# Patient Record
Sex: Female | Born: 1960 | Race: White | Hispanic: No | Marital: Married | Smoking: Never smoker
Health system: Southern US, Community
[De-identification: ages and names within clinical notes are randomized; demographics above are authoritative.]

## PROBLEM LIST (undated history)

## (undated) DIAGNOSIS — I493 Ventricular premature depolarization: Secondary | ICD-10-CM

## (undated) DIAGNOSIS — K589 Irritable bowel syndrome without diarrhea: Secondary | ICD-10-CM

## (undated) DIAGNOSIS — E079 Disorder of thyroid, unspecified: Secondary | ICD-10-CM

## (undated) DIAGNOSIS — I491 Atrial premature depolarization: Secondary | ICD-10-CM

## (undated) DIAGNOSIS — K602 Anal fissure, unspecified: Secondary | ICD-10-CM

## (undated) DIAGNOSIS — F439 Reaction to severe stress, unspecified: Secondary | ICD-10-CM

## (undated) DIAGNOSIS — L409 Psoriasis, unspecified: Secondary | ICD-10-CM

## (undated) HISTORY — DX: Reaction to severe stress, unspecified: F43.9

## (undated) HISTORY — DX: Irritable bowel syndrome, unspecified: K58.9

## (undated) HISTORY — DX: Psoriasis, unspecified: L40.9

## (undated) HISTORY — DX: Atrial premature depolarization: I49.1

## (undated) HISTORY — DX: Anal fissure, unspecified: K60.2

## (undated) HISTORY — DX: Disorder of thyroid, unspecified: E07.9

## (undated) HISTORY — DX: Ventricular premature depolarization: I49.3

---

## 2002-11-05 ENCOUNTER — Other Ambulatory Visit: Admission: RE | Admit: 2002-11-05 | Discharge: 2002-11-05 | Payer: Self-pay | Admitting: Obstetrics & Gynecology

## 2003-12-27 ENCOUNTER — Other Ambulatory Visit: Admission: RE | Admit: 2003-12-27 | Discharge: 2003-12-27 | Payer: Self-pay | Admitting: Obstetrics & Gynecology

## 2004-03-20 ENCOUNTER — Other Ambulatory Visit: Admission: RE | Admit: 2004-03-20 | Discharge: 2004-03-20 | Payer: Self-pay | Admitting: Obstetrics & Gynecology

## 2004-05-08 ENCOUNTER — Encounter: Admission: RE | Admit: 2004-05-08 | Discharge: 2004-05-08 | Payer: Self-pay | Admitting: Family Medicine

## 2004-05-10 ENCOUNTER — Encounter: Admission: RE | Admit: 2004-05-10 | Discharge: 2004-05-10 | Payer: Self-pay | Admitting: Family Medicine

## 2005-01-22 ENCOUNTER — Other Ambulatory Visit: Admission: RE | Admit: 2005-01-22 | Discharge: 2005-01-22 | Payer: Self-pay | Admitting: Obstetrics & Gynecology

## 2006-02-08 ENCOUNTER — Other Ambulatory Visit: Admission: RE | Admit: 2006-02-08 | Discharge: 2006-02-08 | Payer: Self-pay | Admitting: Obstetrics & Gynecology

## 2007-10-03 ENCOUNTER — Encounter: Admission: RE | Admit: 2007-10-03 | Discharge: 2007-10-03 | Payer: Self-pay | Admitting: Family Medicine

## 2009-01-28 ENCOUNTER — Encounter: Admission: RE | Admit: 2009-01-28 | Discharge: 2009-01-28 | Payer: Self-pay | Admitting: Family Medicine

## 2009-08-22 ENCOUNTER — Ambulatory Visit (HOSPITAL_COMMUNITY): Admission: RE | Admit: 2009-08-22 | Discharge: 2009-08-22 | Payer: Self-pay | Admitting: Family Medicine

## 2010-12-04 ENCOUNTER — Encounter
Admission: RE | Admit: 2010-12-04 | Discharge: 2010-12-04 | Payer: Self-pay | Source: Home / Self Care | Attending: Family Medicine | Admitting: Family Medicine

## 2012-06-13 ENCOUNTER — Other Ambulatory Visit: Payer: Self-pay | Admitting: Obstetrics & Gynecology

## 2012-06-13 DIAGNOSIS — R928 Other abnormal and inconclusive findings on diagnostic imaging of breast: Secondary | ICD-10-CM

## 2012-06-22 ENCOUNTER — Ambulatory Visit
Admission: RE | Admit: 2012-06-22 | Discharge: 2012-06-22 | Disposition: A | Discharge status: Home / Self Care | Payer: BC Managed Care – PPO | Source: Ambulatory Visit | Attending: Obstetrics & Gynecology | Admitting: Obstetrics & Gynecology

## 2012-06-22 DIAGNOSIS — R928 Other abnormal and inconclusive findings on diagnostic imaging of breast: Secondary | ICD-10-CM

## 2014-03-02 ENCOUNTER — Encounter: Payer: Self-pay | Admitting: *Deleted

## 2014-03-02 DIAGNOSIS — E0590 Thyrotoxicosis, unspecified without thyrotoxic crisis or storm: Secondary | ICD-10-CM | POA: Insufficient documentation

## 2014-10-08 ENCOUNTER — Encounter: Payer: Self-pay | Admitting: Cardiovascular Disease

## 2014-10-08 ENCOUNTER — Ambulatory Visit (INDEPENDENT_AMBULATORY_CARE_PROVIDER_SITE_OTHER): Payer: BC Managed Care – PPO | Admitting: Cardiovascular Disease

## 2014-10-08 VITALS — BP 126/68 | HR 74 | Ht 69.5 in | Wt 141.8 lb

## 2014-10-08 DIAGNOSIS — R079 Chest pain, unspecified: Secondary | ICD-10-CM | POA: Insufficient documentation

## 2014-10-08 DIAGNOSIS — F419 Anxiety disorder, unspecified: Secondary | ICD-10-CM | POA: Insufficient documentation

## 2014-10-08 DIAGNOSIS — E0590 Thyrotoxicosis, unspecified without thyrotoxic crisis or storm: Secondary | ICD-10-CM

## 2014-10-08 DIAGNOSIS — Z630 Problems in relationship with spouse or partner: Secondary | ICD-10-CM

## 2014-10-08 DIAGNOSIS — R072 Precordial pain: Secondary | ICD-10-CM

## 2014-10-08 NOTE — Progress Notes (Signed)
Patient ID: Kaylee Mayo, female   DOB: Apr 02, 1961, 53 y.o.   MRN: [id number]   53 yo referred by Dr Johny BlamerWilliam Harris for atypical chest pain  Her life is a mess.  Her daughter is an addict and got out of fellowship in August. Still struggling Her grandson is with the father who is also an addict and Kaylee Mayo supervises visits.  She is not getting along with her 2nd husband of 15 years who is not Supportive of the situation since it is not his child.  She works for the [employer] service and there is some job stress She has family history of alcoholism and CAD.   She denies ETOH, drugs, smoking.  SSCP sharp left sided.  Can last hours  Radiates to left arm  Not related to exertion Has had it for a week or two No cough Sputum , fever dyspnea or pleuritic component       ROS: Denies fever, malais, weight loss, blurry vision, decreased visual acuity, cough, sputum, SOB, hemoptysis, pleuritic pain, palpitaitons, heartburn, abdominal pain, melena, lower extremity edema, claudication, or rash.  All other systems reviewed and negative   General: Affect appropriate Chronically ill appearing female  HEENT: normal Neck supple with no adenopathy JVP normal no bruits no thyromegaly Lungs clear with no wheezing and good diaphragmatic motion Heart:  S1/S2 no murmur,rub, gallop or click PMI normal Abdomen: benighn, BS positve, no tenderness, no AAA no bruit.  No HSM or HJR Distal pulses intact with no bruits No edema Neuro non-focal Skin warm and dry No muscular weakness  Medications Current Outpatient Prescriptions  Medication Sig Dispense Refill  . aspirin 81 MG tablet Take 81 mg by mouth daily.      . Calcium Carbonate-Vitamin D (CALCIUM-D PO) Take by mouth daily.      . cyclobenzaprine (FLEXERIL) 10 MG tablet Take 10 mg by mouth 3 (three) times daily as needed for muscle spasms.      . Glucosamine-Chondroit-Vit C-Mn (GLUCOSAMINE 1500 COMPLEX PO) Take by mouth.      . Levothyroxine Sodium  125 MCG CAPS Take by mouth daily before breakfast.      . Multiple Vitamin (MULTIVITAMIN) tablet Take 1 tablet by mouth daily.      . norgestimate-ethinyl estradiol (SPRINTEC 28) 0.25-35 MG-MCG tablet Take 1 tablet by mouth daily.      . Omega-3 Fatty Acids (FISH OIL) 1000 MG CAPS Take by mouth.      . vitamin C (ASCORBIC ACID) 500 MG tablet Take 500 mg by mouth daily.       No current facility-administered medications for this visit.    Allergies Daypro  Family History: Family History  Problem Relation Age of Onset  . Diabetes Mellitus I Mother   . Heart failure Father   . Diabetes Mellitus I Father   . Coronary artery disease Father   . Diabetes Mellitus I Paternal Grandmother   . Hypertension Paternal Grandfather     Social History: History   Social History  . Marital Status: Married    Spouse Name: N/A    Number of Children: N/A  . Years of Education: N/A   Occupational History  . Not on file.   Social History Main Topics  . Smoking status: Never Smoker   . Smokeless tobacco: Not on file  . Alcohol Use: No  . Drug Use: No  . Sexual Activity: Not on file   Other Topics Concern  . Not on file   Social History  Narrative  . No narrative on file    No past surgical history on file.  Past Medical History  Diagnosis Date  . Thyroid disease   . IBS (irritable bowel syndrome)   . Anal fissure   . Psoriasis     Electrocardiogram:    NSR rate 74 normal   Assessment and Plan

## 2014-10-08 NOTE — Assessment & Plan Note (Signed)
Atypical likely related to stress f/u exercise ETT given family history

## 2014-10-08 NOTE — Patient Instructions (Signed)

## 2014-10-08 NOTE — Assessment & Plan Note (Signed)
Continue replacement recent TSH normal f/u primary

## 2014-10-08 NOTE — Assessment & Plan Note (Signed)
Primary has given her PRN lorazapam  Consider adding SSRI

## 2014-10-08 NOTE — Assessment & Plan Note (Signed)
Issues with husband, son in law and daughters addiction all devistating for her.  Not clear that she can stay healthy in the middle of  All this especially with grandson not living with her.  She has tried to go to marriage counseling but husband will not go.

## 2014-10-09 ENCOUNTER — Telehealth (HOSPITAL_COMMUNITY): Payer: Self-pay

## 2014-10-09 NOTE — Telephone Encounter (Signed)
Encounter complete. 

## 2014-10-10 ENCOUNTER — Telehealth (HOSPITAL_COMMUNITY): Payer: Self-pay

## 2014-10-10 NOTE — Telephone Encounter (Signed)
Encounter complete. 

## 2014-10-11 ENCOUNTER — Ambulatory Visit (HOSPITAL_COMMUNITY)
Admission: RE | Admit: 2014-10-11 | Discharge: 2014-10-11 | Disposition: A | Discharge status: Home / Self Care | Payer: BC Managed Care – PPO | Source: Ambulatory Visit | Attending: Internal Medicine | Admitting: Internal Medicine

## 2014-10-11 DIAGNOSIS — R079 Chest pain, unspecified: Secondary | ICD-10-CM | POA: Diagnosis present

## 2014-10-11 NOTE — Procedures (Signed)
Exercise Treadmill Test  Test  Exercise Tolerance Test Ordering MD: Charlton HawsPeter Nishan, MD  Interpreting MD:   Unique Test No: 1 Treadmill:  1  Indication for ETT: chest pain - rule out ischemia  Contraindication to ETT: No   Stress Modality: exercise - treadmill  Cardiac Imaging Performed: non   Protocol: standard Bruce - maximal  Max BP:  182/94  Max MPHR (bpm):167 85% MPR (bpm):  141  MPHR obtained (bpm): 164 % MPHR obtained:  98  Reached 85% MPHR (min:sec): 7:55 Total Exercise Time (min-sec):  10:57  Workload in METS:  13.30 Borg Scale:   Reason ETT Terminated: leg  fatigue    ST Segment Analysis At Rest: normal ST segments - no evidence of significant ST depression With Exercise: upsloping ST segment depression  Other Information Arrhythmia:  No Angina during ETT:  absent (0) Quality of ETT:  diagnostic  ETT Interpretation:  normal - no evidence of ischemia by ST analysis  Comments: Excellent exercise tolerance <1 mm upsloping ST segment changes with exercise Duke Treadmill Score of +11 PVC's noted during exercise and recovery  Chrystie NoseKenneth C. Hilty, MD, [facility]ACC Attending Cardiologist [facility]

## 2014-10-16 ENCOUNTER — Telehealth: Payer: Self-pay | Admitting: Cardiovascular Disease

## 2014-10-16 NOTE — Telephone Encounter (Signed)
Patient advised treadmill test was normal, voiced  Good understanding.

## 2014-10-16 NOTE — Telephone Encounter (Signed)
New message    Calling for test results  

## 2014-10-28 ENCOUNTER — Ambulatory Visit: Payer: BC Managed Care – PPO | Admitting: Cardiology

## 2015-11-13 ENCOUNTER — Ambulatory Visit
Admission: RE | Admit: 2015-11-13 | Discharge: 2015-11-13 | Disposition: A | Discharge status: Home / Self Care | Payer: BC Managed Care – PPO | Source: Ambulatory Visit | Attending: Family Medicine | Admitting: Family Medicine

## 2015-11-13 ENCOUNTER — Other Ambulatory Visit: Payer: Self-pay | Admitting: Family Medicine

## 2015-11-13 DIAGNOSIS — R0789 Other chest pain: Secondary | ICD-10-CM

## 2017-05-01 IMAGING — CR DG RIBS W/ CHEST 3+V*R*
3 series · 3 of 3 positions shown · non-contrast
Comparison: CT scan of the chest December 04, 2010

CLINICAL DATA: Palpable chest wall prominence in the right lower
parasternal region, duration of symptoms 7 months

EXAM:
RIGHT RIBS AND CHEST - 3+ VIEW

[w chest pa]
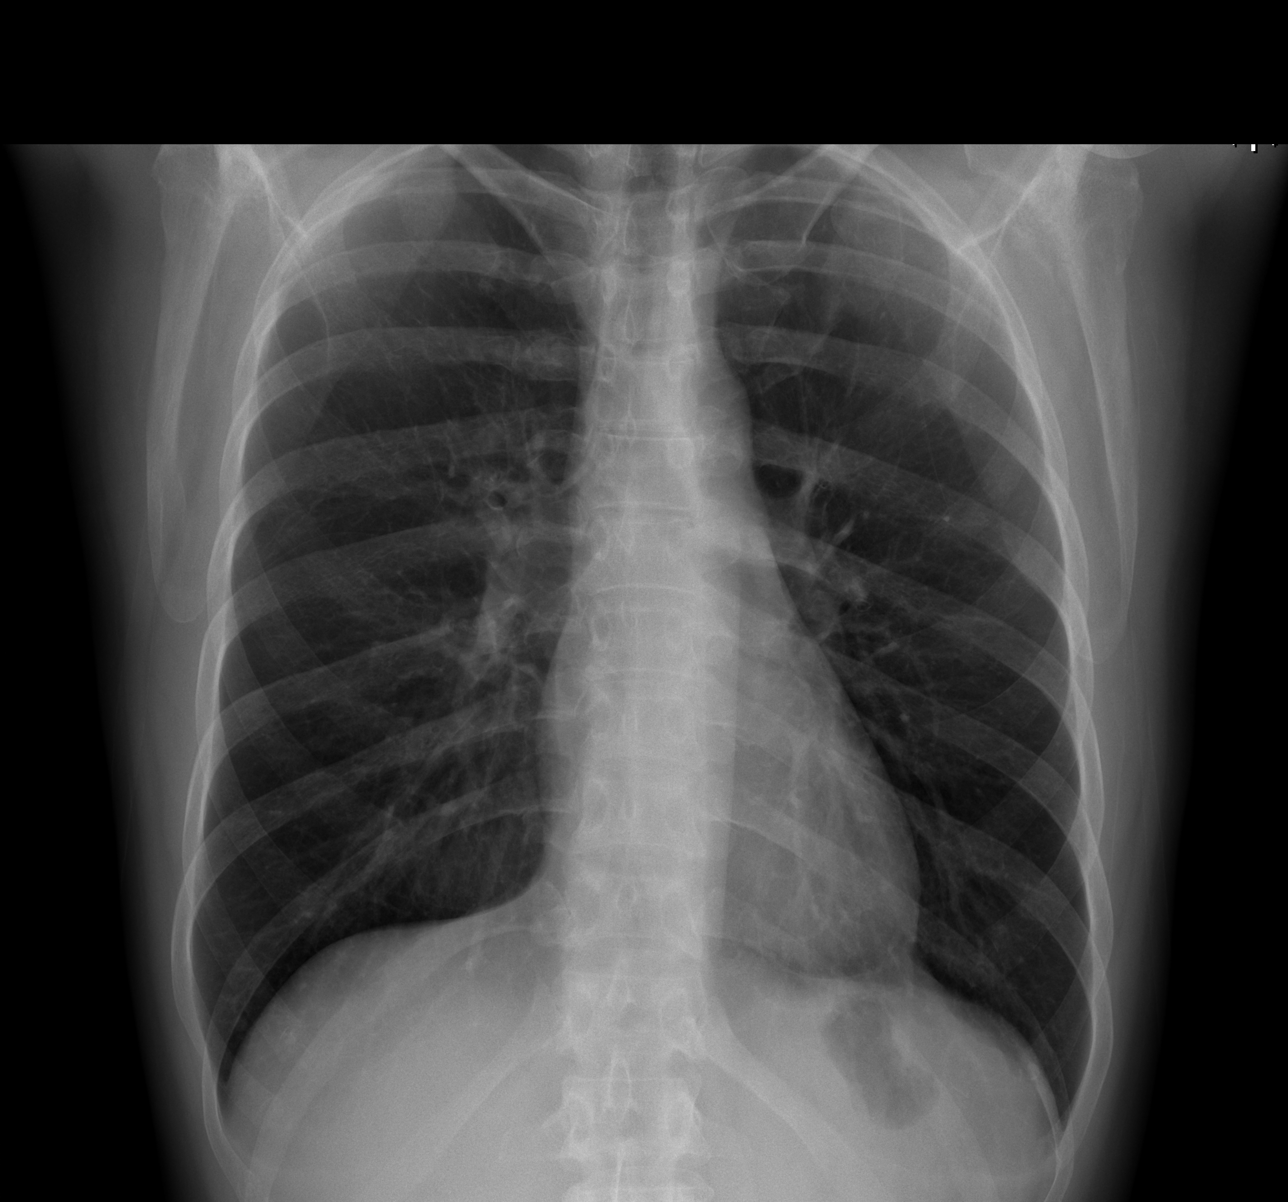

[w ribs ap lower right]
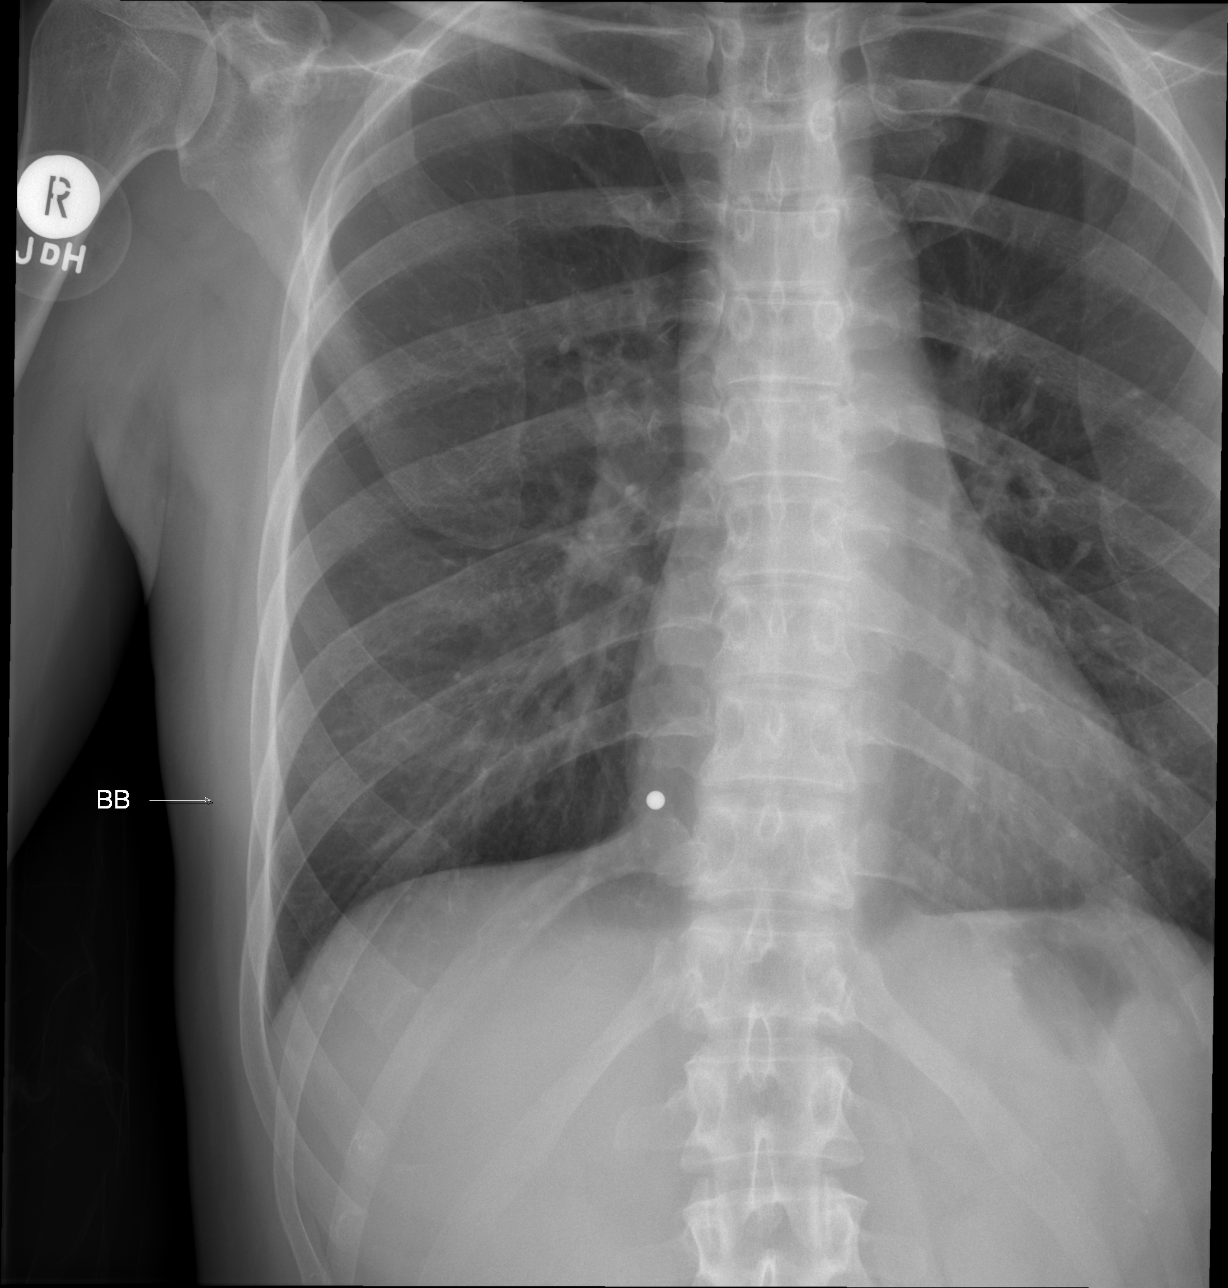

[w ribs obl right]
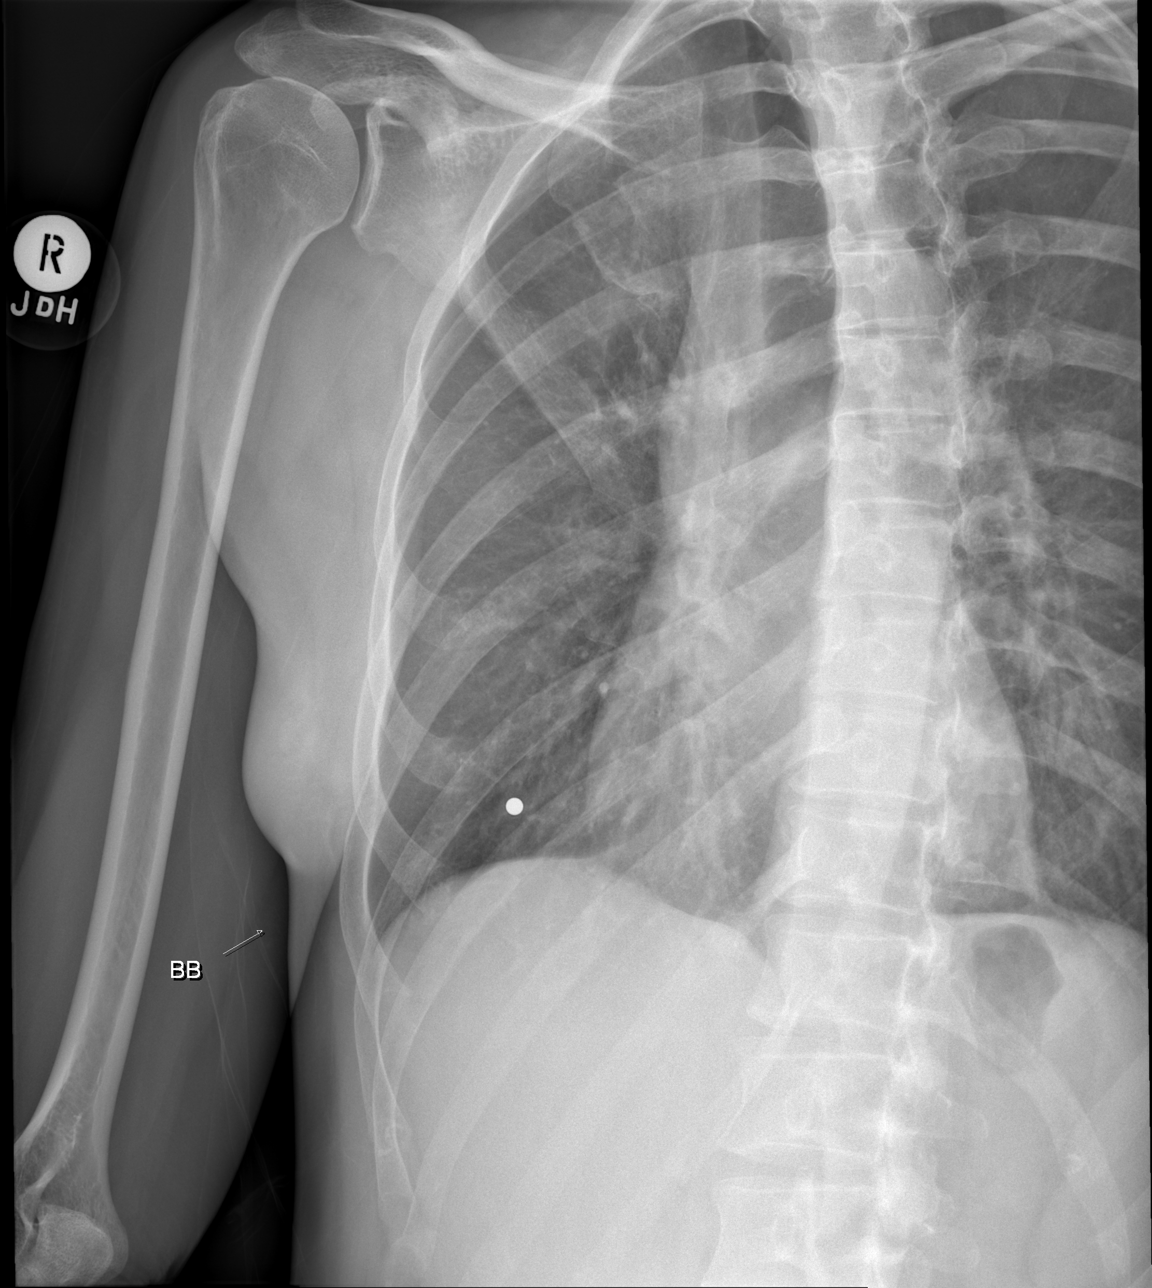

[3 of 3 positions shown; findings below may reference images not displayed]

FINDINGS: The right ribs are adequately mineralized. There is no lytic or
blastic lesion or periosteal reaction. The metallic BB projects in
the region of the medial portion of the 6th costal cartilage. The
lungs are clear. The heart and mediastinal structures are normal.
IMPRESSION: No bony abnormality is observed. No soft tissue mass is demonstrated
in the area of clinical concern. If further imaging is felt
indicated, CT scanning would be the most useful next step. This
would allow direct comparison with the November 2010 chest CT scan.

## 2017-09-09 ENCOUNTER — Telehealth: Payer: Self-pay | Admitting: *Deleted

## 2017-09-09 NOTE — Telephone Encounter (Signed)
NOTES SENT TO SCHEDULING.  °

## 2017-10-11 NOTE — Progress Notes (Signed)
Cardiology Office Note   Date:  10/14/2017   ID:  Kaylee Mayo, DOB [birth year], MRN [id number]  PCP:  Johny Blamer, MD  Cardiologist:   Charlton Haws, MD   No chief complaint on file.     History of Present Illness: Kaylee Mayo is a 56 y.o. female who presents for consultation regarding palpitations. Referred by Dr Tiburcio Pea Last seen by me 3 years ago. Atypical chest pain Had normal ETT 10/11/14 Daughter is an addict and been in [facility] At time stress with 2nd husband and caring for grandson. Works for [employer] Family history of alcoholism and CAD  Having daily palpitations Thyroid has been labile and dose synthroid decreased twice recently No syncope chest pain or syncope. In office having PAC;s. Some stress relationship. No drugs or Excess caffeine    Past Medical History:  Diagnosis Date  . Anal fissure   . IBS (irritable bowel syndrome)   . Psoriasis   . Thyroid disease     History reviewed. No pertinent surgical history.   Current Outpatient Prescriptions  Medication Sig Dispense Refill  . aspirin 81 MG tablet Take 81 mg by mouth daily as needed.     . Calcium Carbonate-Vitamin D (CALCIUM-D PO) Take by mouth daily.    . cyclobenzaprine (FLEXERIL) 10 MG tablet Take 10 mg by mouth 3 (three) times daily as needed for muscle spasms.    . fluticasone (FLONASE) 50 MCG/ACT nasal spray Place 1 spray into both nostrils as needed.    . Glucosamine-Chondroit-Vit C-Mn (GLUCOSAMINE 1500 COMPLEX PO) Take by mouth.    . IBUPROFEN PO Take by mouth as needed (back pain).    Marland Kitchen levothyroxine (SYNTHROID, LEVOTHROID) 100 MCG tablet Take 100 mcg by mouth daily.    . meloxicam (MOBIC) 15 MG tablet Take 15 mg by mouth as needed.    . Multiple Vitamin (MULTIVITAMIN) tablet Take 1 tablet by mouth daily.    . norgestimate-ethinyl estradiol (SPRINTEC 28) 0.25-35 MG-MCG tablet Take 1 tablet by mouth daily.    . Omega-3 Fatty Acids (FISH OIL) 1000 MG CAPS Take by  mouth.    . ValACYclovir HCl (VALTREX PO) Take by mouth as needed (Cold sore).    . vitamin C (ASCORBIC ACID) 500 MG tablet Take 500 mg by mouth daily.    . propranolol (INDERAL) 20 MG tablet Take 1 tablet (20 mg total) by mouth as needed (palpitations). 30 tablet 3   No current facility-administered medications for this visit.     Allergies:   Sulfa antibiotics and Daypro [oxaprozin]    Social History:  The patient  reports that she has never smoked. She has never used smokeless tobacco. She reports that she does not drink alcohol or use drugs.   Family History:  The patient's family history includes Coronary artery disease in her father; Diabetes Mellitus I in her father, mother, and paternal grandmother; Heart failure in her father; Hypertension in her paternal grandfather.    ROS:  Please see the history of present illness.   Otherwise, review of systems are positive for none.   All other systems are reviewed and negative.    PHYSICAL EXAM: VS:  BP 108/66   Pulse 99   Ht 5' 9.5" (1.765 m)   Wt 148 lb 1.9 oz (67.2 kg)   SpO2 99%   BMI 21.56 kg/m  , BMI Body mass index is 21.56 kg/m. Affect appropriate Healthy:  appears stated age HEENT: normal Neck supple with no adenopathy  JVP normal no bruits no thyromegaly Lungs clear with no wheezing and good diaphragmatic motion Heart:  S1/S2 no murmur, no rub, gallop or click PMI normal Abdomen: benighn, BS positve, no tenderness, no AAA no bruit.  No HSM or HJR Distal pulses intact with no bruits No edema Neuro non-focal Skin warm and dry No muscular weakness    EKG:  2015 SR rate 74 normal m11/2/18 SR PAC;s LAE    Recent Labs: No results found for requested labs within last 8760 hours.    Lipid Panel No results found for: CHOL, TRIG, HDL, CHOLHDL, VLDL, LDLCALC, LDLDIRECT    Wt Readings from Last 3 Encounters:  10/14/17 148 lb 1.9 oz (67.2 kg)  10/08/14 141 lb 12.8 oz (64.3 kg)      Other studies  Reviewed: Additional studies/ records that were reviewed today include: Cardiology notes 2015 Primary care notes labs ECG ETT 2015.    ASSESSMENT AND PLAN:  1.  Palpitations PAC;s likely related to stress and abnormal thyroid fluctuations PRN Inderal, 48 hour holter And echo ordered f/u 3 months R/O PAF  2. Thyroid follow TSH/T4/T3 with primary more closely. Related to arrhythmia 3. Anxiety / Depression chronic related to inability to see grand kids regularly and daughter being in jail    Current medicines are reviewed at length with the patient today.  The patient does not have concerns regarding medicines.  The following changes have been made:  PRN Inderal   Labs/ tests ordered today include: Holter, Echo   Orders Placed This Encounter  Procedures  . HOLTER MONITOR - 48 HOUR  . EKG 12-Lead  . ECHOCARDIOGRAM COMPLETE     Disposition:   FU with cardiology PRN      Signed, Charlton HawsPeter Xavien Dauphinais, MD  10/14/2017 8:55 AM    [facility] [address], [place], [place]  [address] Phone: [phone]; Fax: [phone]

## 2017-10-14 ENCOUNTER — Encounter (INDEPENDENT_AMBULATORY_CARE_PROVIDER_SITE_OTHER): Payer: Self-pay

## 2017-10-14 ENCOUNTER — Ambulatory Visit (INDEPENDENT_AMBULATORY_CARE_PROVIDER_SITE_OTHER): Payer: BC Managed Care – PPO | Admitting: Cardiovascular Disease

## 2017-10-14 ENCOUNTER — Encounter: Payer: Self-pay | Admitting: Cardiovascular Disease

## 2017-10-14 VITALS — BP 108/66 | HR 99 | Ht 69.5 in | Wt 148.1 lb

## 2017-10-14 DIAGNOSIS — R002 Palpitations: Secondary | ICD-10-CM | POA: Diagnosis not present

## 2017-10-14 DIAGNOSIS — I491 Atrial premature depolarization: Secondary | ICD-10-CM

## 2017-10-14 MED ORDER — PROPRANOLOL HCL 20 MG PO TABS: 20.0000 mg | ORAL_TABLET | ORAL | 3 refills | Status: DC | PRN

## 2017-10-14 NOTE — Patient Instructions (Addendum)
Medication Instructions:  Your physician has recommended you make the following change in your medication:  1-START Inderal 20 mg by mouth as needed for palpitations.  Labwork: NONE  Testing/Procedures: Your physician has requested that you have an echocardiogram. Echocardiography is a painless test that uses sound waves to create images of your heart. It provides your doctor with information about the size and shape of your heart and how well your heart's chambers and valves are working. This procedure takes approximately one hour. There are no restrictions for this procedure.  Your physician has recommended that you wear a 48 hour holter monitor. Holter monitors are medical devices that record the heart's electrical activity. Doctors most often use these monitors to diagnose arrhythmias. Arrhythmias are problems with the speed or rhythm of the heartbeat. The monitor is a small, portable device. You can wear one while you do your normal daily activities. This is usually used to diagnose what is causing palpitations/syncope (passing out).  Follow-Up: Your physician wants you to follow-up in: next available with Dr. Eden EmmsNishan.    If you need a refill on your cardiac medications before your next appointment, please call your pharmacy.

## 2017-11-15 ENCOUNTER — Ambulatory Visit (INDEPENDENT_AMBULATORY_CARE_PROVIDER_SITE_OTHER): Payer: BC Managed Care – PPO

## 2017-11-15 ENCOUNTER — Other Ambulatory Visit: Payer: Self-pay

## 2017-11-15 ENCOUNTER — Ambulatory Visit (HOSPITAL_COMMUNITY): Payer: BC Managed Care – PPO | Attending: Cardiology

## 2017-11-15 DIAGNOSIS — I491 Atrial premature depolarization: Secondary | ICD-10-CM | POA: Diagnosis not present

## 2017-11-15 DIAGNOSIS — R002 Palpitations: Secondary | ICD-10-CM | POA: Diagnosis not present

## 2017-11-15 DIAGNOSIS — E079 Disorder of thyroid, unspecified: Secondary | ICD-10-CM | POA: Insufficient documentation

## 2017-11-24 ENCOUNTER — Telehealth: Payer: Self-pay

## 2017-11-24 DIAGNOSIS — I441 Atrioventricular block, second degree: Secondary | ICD-10-CM

## 2017-11-24 DIAGNOSIS — R9431 Abnormal electrocardiogram [ECG] [EKG]: Secondary | ICD-10-CM

## 2017-11-24 DIAGNOSIS — Z9189 Other specified personal risk factors, not elsewhere classified: Secondary | ICD-10-CM

## 2017-11-24 NOTE — Telephone Encounter (Signed)
-----   Message from Wendall StadePeter C Nishan, MD sent at 11/23/2017  9:35 PM EST ----- Abnormal would refer to EP for consultations in regard to suppressing ectopy in face of some Wenkebach AV block Have her schedule exercise myovue as well r/o CAD Echo showed normal EF

## 2017-11-24 NOTE — Telephone Encounter (Signed)
Left message for patient to call back  

## 2017-12-13 NOTE — Progress Notes (Deleted)
Cardiology Office Note   Date:  12/13/2017   ID:  Kaylee Popperancy L Hagin, DOB 01/01/1961, MRN [id number]  PCP:  Johny BlamerHarris, William, MD  Cardiologist:   Charlton HawsPeter Nishan, MD   No chief complaint on file.     History of Present Illness: Kaylee Mayo is a 57 y.o. female who presents for f/u palpitations Initially seen November 2018 . Referred by Dr Tiburcio PeaHarris Last seen by me 3 years ago. Atypical chest pain Had normal ETT 10/11/14 Daughter is an addict and been in [facility] At time stress with 2nd husband and caring for grandson. Works for [employer] Family history of alcoholism and CAD  Having daily palpitations Thyroid has been labile and dose synthroid decreased twice recently No syncope chest pain or syncope. In office having PAC;s. Some stress relationship. No drugs or Excess caffeine   Echo 11/15/17 showed EF 55-60% trivial MR normal RV and PA pressures Holter showed NSR average HR 70 short bursts atrial tachycardia NSVT 3 beats Rare episodes of wenkebach type 2  She is pending a myocardial perfusion study and f/u with EP   ***  Past Medical History:  Diagnosis Date  . Anal fissure   . IBS (irritable bowel syndrome)   . Psoriasis   . Thyroid disease     No past surgical history on file.   Current Outpatient Medications  Medication Sig Dispense Refill  . aspirin 81 MG tablet Take 81 mg by mouth daily as needed.     . Calcium Carbonate-Vitamin D (CALCIUM-D PO) Take by mouth daily.    . cyclobenzaprine (FLEXERIL) 10 MG tablet Take 10 mg by mouth 3 (three) times daily as needed for muscle spasms.    . fluticasone (FLONASE) 50 MCG/ACT nasal spray Place 1 spray into both nostrils as needed.    . Glucosamine-Chondroit-Vit C-Mn (GLUCOSAMINE 1500 COMPLEX PO) Take by mouth.    . IBUPROFEN PO Take by mouth as needed (back pain).    Marland Kitchen. levothyroxine (SYNTHROID, LEVOTHROID) 100 MCG tablet Take 100 mcg by mouth daily.    . meloxicam (MOBIC) 15 MG tablet Take 15 mg by mouth as  needed.    . Multiple Vitamin (MULTIVITAMIN) tablet Take 1 tablet by mouth daily.    . norgestimate-ethinyl estradiol (SPRINTEC 28) 0.25-35 MG-MCG tablet Take 1 tablet by mouth daily.    . Omega-3 Fatty Acids (FISH OIL) 1000 MG CAPS Take by mouth.    . propranolol (INDERAL) 20 MG tablet Take 1 tablet (20 mg total) by mouth as needed (palpitations). 30 tablet 3  . ValACYclovir HCl (VALTREX PO) Take by mouth as needed (Cold sore).    . vitamin C (ASCORBIC ACID) 500 MG tablet Take 500 mg by mouth daily.     No current facility-administered medications for this visit.     Allergies:   Sulfa antibiotics and Daypro [oxaprozin]    Social History:  The patient  reports that  has never smoked. she has never used smokeless tobacco. She reports that she does not drink alcohol or use drugs.   Family History:  The patient's family history includes Coronary artery disease in her father; Diabetes Mellitus I in her father, mother, and paternal grandmother; Heart failure in her father; Hypertension in her paternal grandfather.    ROS:  Please see the history of present illness.   Otherwise, review of systems are positive for none.   All other systems are reviewed and negative.    PHYSICAL EXAM: VS:  There were no vitals  taken for this visit. , BMI There is no height or weight on file to calculate BMI. Affect appropriate Healthy:  appears stated age HEENT: normal Neck supple with no adenopathy JVP normal no bruits no thyromegaly Lungs clear with no wheezing and good diaphragmatic motion Heart:  S1/S2 no murmur, no rub, gallop or click PMI normal Abdomen: benighn, BS positve, no tenderness, no AAA no bruit.  No HSM or HJR Distal pulses intact with no bruits No edema Neuro non-focal Skin warm and dry No muscular weakness     EKG:  2015 SR rate 74 normal m11/2/18 SR PAC;s LAE    Recent Labs: No results found for requested labs within last 8760 hours.    Lipid Panel No results found for:  CHOL, TRIG, HDL, CHOLHDL, VLDL, LDLCALC, LDLDIRECT    Wt Readings from Last 3 Encounters:  10/14/17 148 lb 1.9 oz (67.2 kg)  10/08/14 141 lb 12.8 oz (64.3 kg)      Other studies Reviewed: Additional studies/ records that were reviewed today include: Cardiology notes 2015 Primary care notes labs ECG ETT 2015.    ASSESSMENT AND PLAN:  1.  Palpitations *** 2. Thyroid follow TSH/T4/T3 with primary more closely. Related to arrhythmia 3. Anxiety / Depression chronic related to inability to see grand kids regularly and daughter being in jail    Current medicines are reviewed at length with the patient today.  The patient does not have concerns regarding medicines.  The following changes have been made:     Labs/ tests ordered today include:    No orders of the defined types were placed in this encounter.    Disposition:   FU with EP 8 weeks and me in 6 months     Signed, Charlton Haws, MD  12/13/2017 7:45 PM    [facility] [address] [place], [place], [place]  [address] Phone: [phone]; Fax: [phone]

## 2017-12-16 ENCOUNTER — Telehealth: Payer: Self-pay | Admitting: Cardiovascular Disease

## 2017-12-16 NOTE — Telephone Encounter (Signed)
Mrs. Kaylee Mayo is calling to find out about a test and whether she needs to keep her appt with Dr. Eden EmmsNishan . Please Call +

## 2017-12-16 NOTE — Telephone Encounter (Signed)
Called patient back about her message. Patient is suppose to see an EP doctor and have a myoview after her previous testing. [facility]. Informed patient the reason behind having the myoview test done, and informed her that she needs to contact her insurance for cost. Informed patient that someone from [facility] Would call her and discuss in detail of all that involves myoview test. Informed patient that someone in scheduling will call her with an appointment with EP doctor. Patient verbalized understanding.

## 2017-12-21 ENCOUNTER — Telehealth: Payer: Self-pay | Admitting: Cardiovascular Disease

## 2017-12-21 ENCOUNTER — Telehealth (HOSPITAL_COMMUNITY): Payer: Self-pay | Admitting: Cardiovascular Disease

## 2017-12-21 DIAGNOSIS — Z9189 Other specified personal risk factors, not elsewhere classified: Secondary | ICD-10-CM

## 2017-12-21 DIAGNOSIS — R9431 Abnormal electrocardiogram [ECG] [EKG]: Secondary | ICD-10-CM

## 2017-12-21 DIAGNOSIS — R002 Palpitations: Secondary | ICD-10-CM

## 2017-12-21 DIAGNOSIS — I441 Atrioventricular block, second degree: Secondary | ICD-10-CM

## 2017-12-21 NOTE — Telephone Encounter (Signed)
I called patient and she stated that she needed to check and see how much the test would cost before scheduling.

## 2017-12-21 NOTE — Telephone Encounter (Signed)
Patient is suppose to have a myoview stress test, but this is too expensive for her. Patient wants to know if there is an alternative to having myoview. Will forward to Dr. Eden EmmsNishan for advisement.

## 2017-12-21 NOTE — Telephone Encounter (Signed)
New Message   Patient is calling about an echo stress that Dr. Eden EmmsNishan is wanting her to have. She indicates that it will be too expensive for her to have. She would like to know does she have any other options that are less cost effective. Please call.

## 2017-12-22 ENCOUNTER — Ambulatory Visit: Payer: BC Managed Care – PPO | Admitting: Cardiovascular Disease

## 2017-12-22 NOTE — Telephone Encounter (Signed)
Her baseline ECG has some changes on it so ETT not possible see if stress echo is cheaper

## 2017-12-22 NOTE — Telephone Encounter (Signed)
Called patient about Dr. Fabio BeringNishan's recommendations. Patient will check with her insurance to see how much this will cost her.

## 2017-12-29 ENCOUNTER — Telehealth (HOSPITAL_COMMUNITY): Payer: Self-pay | Admitting: Cardiovascular Disease

## 2017-12-29 ENCOUNTER — Telehealth: Payer: Self-pay | Admitting: Cardiovascular Disease

## 2017-12-29 NOTE — Telephone Encounter (Signed)
New message    Patient calling to confirm testing needed. Patient given the estimate cost of stress echo $2500 (est only). Patient requesting to speak with nurse to clarify testing. Please call

## 2017-12-29 NOTE — Telephone Encounter (Signed)
User: Trina AoGRIFFIN, Jonica Bickhart A Date/time: 12/29/17 1:15 PM  Comment: Called pt back and lmsg for her to Cb to sch stress echo .   Context:  Outcome: Left Message  Phone number: [phone] Phone Type: Home Phone  Comm. type: Telephone Call type: Outgoing  Contact: Biagio BorgBlackwood, Teona L Relation to patient: Self    User: Trina AoGRIFFIN, Molly Savarino A Date/time: 12/29/17 11:33 AM  Comment: Called pt and lmsg for her to CB to get scheduled for a stress echo.   Context:  Outcome: Left Message  Phone number: [phone] Phone Type: Home Phone  Comm. type: Telephone Call type: Outgoing  Contact: Biagio BorgBlackwood, Starletta L Relation to patient: Self

## 2017-12-29 NOTE — Telephone Encounter (Signed)
Called patient and discussed what is involved in a stress echo. Patient has agreed to schedule stress echo. Sent message to scheduling to make patient an appointment. Went over instructions for test with patient. Patient verbalized understanding.

## 2018-01-05 ENCOUNTER — Telehealth (HOSPITAL_COMMUNITY): Payer: Self-pay | Admitting: *Deleted

## 2018-01-05 NOTE — Telephone Encounter (Signed)
Left message on voicemail per DPR in reference to upcoming appointment scheduled on 12/30/17 at 2:30 with detailed instructions given per Stress Test Requisition Sheet for the test. LM to arrive 30 minutes early, and that it is imperative to arrive on time for appointment to keep from having the test rescheduled. If you need to cancel or reschedule your appointment, please call the office within 24 hours of your appointment. Failure to do so may result in a cancellation of your appointment, and a $50 no show fee. Phone number given for call back for any questions. Daneil DolinSharon S Brooks

## 2018-01-09 ENCOUNTER — Ambulatory Visit (HOSPITAL_COMMUNITY): Payer: BC Managed Care – PPO | Attending: Cardiology

## 2018-01-09 ENCOUNTER — Ambulatory Visit (HOSPITAL_COMMUNITY): Payer: BC Managed Care – PPO

## 2018-01-09 DIAGNOSIS — R002 Palpitations: Secondary | ICD-10-CM

## 2018-01-09 DIAGNOSIS — I441 Atrioventricular block, second degree: Secondary | ICD-10-CM

## 2018-01-09 DIAGNOSIS — Z9189 Other specified personal risk factors, not elsewhere classified: Secondary | ICD-10-CM | POA: Diagnosis not present

## 2018-01-09 DIAGNOSIS — R9431 Abnormal electrocardiogram [ECG] [EKG]: Secondary | ICD-10-CM

## 2018-01-10 ENCOUNTER — Telehealth: Payer: Self-pay | Admitting: Cardiovascular Disease

## 2018-01-10 NOTE — Telephone Encounter (Signed)
New message ° °Pt verbalized that she is returning call for RN °

## 2018-01-11 NOTE — Telephone Encounter (Signed)
Patient is wanting to know if she still needs to see an EP doctor. Informed patient that she still needs to see EP doctor. Informed patient that a message would be sent to EP scheduler to call her. Encouraged patient to take propranolol for palpitations. Patient stated she does not feel like she needs it every day. Informed patient that if she is taking it more often to give our office a call. Patient verbalized understanding.

## 2018-01-11 NOTE — Telephone Encounter (Signed)
Left message for patient to call back  

## 2018-02-07 ENCOUNTER — Encounter: Payer: Self-pay | Admitting: Internal Medicine

## 2018-02-07 ENCOUNTER — Ambulatory Visit: Payer: BC Managed Care – PPO | Admitting: Internal Medicine

## 2018-02-07 VITALS — BP 130/70 | HR 92 | Ht 69.5 in

## 2018-02-07 DIAGNOSIS — R002 Palpitations: Secondary | ICD-10-CM

## 2018-02-07 DIAGNOSIS — I441 Atrioventricular block, second degree: Secondary | ICD-10-CM

## 2018-02-07 NOTE — Progress Notes (Signed)
ELECTROPHYSIOLOGY CONSULT NOTE  Patient ID: Kaylee Mayo, MRN: [id number], DOB/AGE: 58-Oct-1962 58 y.o. Admit date: (Not on file) Date of Consult: 02/07/2018  Primary Physician: Johny Blamer, MD Primary Cardiologist: Darrin Nipper Armstead is a 57 y.o. female who is being seen today for the evaluation of palpiattions  at the request of [facility] .    HPI Kaylee Mayo is a 57 y.o. female Referred because of palpitations. They became increasingly frequent after deployment for [facility]   She was seen by Dr. Genene Churn a couple of months ago.  She was noted to have PACs in the office.  She underwent Holter monitoring which is described below.  It was interpreted as Wenckebach and with her history of modest bradycardia question was raised as to the right treatment for identified PVCs and PACs that were quite frequent  She underwent an extensive cardiovascular evaluation including a stress echo and noted to have freq PVCs and PACs, although strips are not available.   Echo 12/18 was normal  No issues with exercise tolerance  She works as a Therapist, art reviewed  No Wenckebach noted despite it described---indeed pauses but only PACs-blocked freq PACs and PVCs  Past Medical History:  Diagnosis Date  . Anal fissure   . IBS (irritable bowel syndrome)   . Psoriasis   . Thyroid disease       Surgical History: History reviewed. No pertinent surgical history.   Home Meds: Prior to Admission medications   Medication Sig Start Date End Date Taking? Authorizing Provider  aspirin 81 MG tablet Take 81 mg by mouth daily as needed.     [provider]  Calcium Carbonate-Vitamin D (CALCIUM-D PO) Take by mouth daily.    [provider]  cyclobenzaprine (FLEXERIL) 10 MG tablet Take 10 mg by mouth 3 (three) times daily as needed for muscle spasms.    [provider]  fluticasone (FLONASE) 50 MCG/ACT nasal spray Place 1 spray into  both nostrils as needed. 09/30/17   [provider]  Glucosamine-Chondroit-Vit C-Mn (GLUCOSAMINE 1500 COMPLEX PO) Take by mouth.    [provider]  IBUPROFEN PO Take by mouth as needed (back pain).    [provider]  levothyroxine (SYNTHROID, LEVOTHROID) 100 MCG tablet Take 100 mcg by mouth daily. 10/11/17   [provider]  meloxicam (MOBIC) 15 MG tablet Take 15 mg by mouth as needed. 10/06/17   [provider]  Multiple Vitamin (MULTIVITAMIN) tablet Take 1 tablet by mouth daily.    [provider]  norgestimate-ethinyl estradiol (SPRINTEC 28) 0.25-35 MG-MCG tablet Take 1 tablet by mouth daily.    [provider]  Omega-3 Fatty Acids (FISH OIL) 1000 MG CAPS Take by mouth.    [provider]  propranolol (INDERAL) 20 MG tablet Take 1 tablet (20 mg total) by mouth as needed (palpitations). 10/14/17   Wendall Stade, MD  ValACYclovir HCl (VALTREX PO) Take by mouth as needed (Cold sore).    [provider]  vitamin C (ASCORBIC ACID) 500 MG tablet Take 500 mg by mouth daily.    [provider]    Allergies:  Allergies  Allergen Reactions  . Sulfa Antibiotics Hives  . Daypro [Oxaprozin]     Social History   Socioeconomic History  . Marital status: Married    Spouse name: Not on file  . Number of children: Not on file  .  Years of education: Not on file  . Highest education level: Not on file  Social Needs  . Financial resource strain: Not on file  . Food insecurity - worry: Not on file  . Food insecurity - inability: Not on file  . Transportation needs - medical: Not on file  . Transportation needs - non-medical: Not on file  Occupational History  . Not on file  Tobacco Use  . Smoking status: Never Smoker  . Smokeless tobacco: Never Used  Substance and Sexual Activity  . Alcohol use: No  . Drug use: No  . Sexual activity: Not on file  Other Topics Concern  . Not on file  Social History  Narrative  . Not on file     Family History  Problem Relation Age of Onset  . Diabetes Mellitus I Mother   . Heart failure Father   . Diabetes Mellitus I Father   . Coronary artery disease Father   . Diabetes Mellitus I Paternal Grandmother   . Hypertension Paternal Grandfather      ROS:  Please see the history of present illness.     All other systems reviewed and negative.    Physical Exam: S  Blood pressure 130/70, pulse 92, height 5' 9.5" (1.765 m). General: Well developed, well nourished female in no acute distress. Head: Normocephalic, atraumatic, sclera non-icteric, no xanthomas, nares are without discharge. EENT: normal  Lymph Nodes:  none Neck: Negative for carotid bruits. JVD not elevated. Back:without scoliosis kyphosis  Lungs: Clear bilaterally to auscultation without wheezes, rales, or rhonchi. Breathing is unlabored. Heart: RRR with S1 S2. No   murmur . No rubs, or gallops appreciated. Abdomen: Soft, non-tender, non-distended with normoactive bowel sounds. No hepatomegaly. No rebound/guarding. No obvious abdominal masses. Msk:  Strength and tone appear normal for age. Extremities: No clubbing or cyanosis. No* edema.  Distal pedal pulses are 2+ and equal bilaterally. Skin: Warm and Dry Neuro: Alert and oriented X 3. CN III-XII intact Grossly normal sensory and motor function . Psych:  Responds to questions appropriately with a normal affect.      Labs: Cardiac Enzymes No results for input(s): CKTOTAL, CKMB, TROPONINI in the last 72 hours. CBC No results found for: WBC, HGB, HCT, MCV, PLT PROTIME: No results for input(s): LABPROT, INR in the last 72 hours. Chemistry No results for input(s): NA, K, CL, CO2, BUN, CREATININE, CALCIUM, PROT, BILITOT, ALKPHOS, ALT, AST, GLUCOSE in the last 168 hours.  Invalid input(s): LABALBU Lipids No results found for: CHOL, HDL, LDLCALC, TRIG BNP No results found for: PROBNP Thyroid Function Tests: No results for  input(s): TSH, T4TOTAL, T3FREE, THYROIDAB in the last 72 hours.  Invalid input(s): FREET3 Miscellaneous No results found for: DDIMER  Radiology/Studies:  No results found.  EKG: Sinus at 92 Intervals 14/09/34 Axis 72 PACs and PVCs   Assessment and Plan:  PVCs/PACs with palpitations  Blocked PACs misinterpreted as Mobitz 1 heart block    We discussed the physiology of PACs PVCs and how it was that the blocked PACs look like heart block.  We reviewed the lack of negative prognostic associations with these extra beats reassuring her of their benign nature.  She will continue to use the Inderal on a as needed basis.  In the event that this is inadequate, we could use calcium blockers and or a 1C agent.  The fact that they are nocturnal suggest that the beta-blocker may not be all that effective.     Sherryl MangesSteven Vishnu Moeller

## 2018-02-07 NOTE — Patient Instructions (Signed)
Medication Instructions:  Your physician recommends that you continue on your current medications as directed. Please refer to the Current Medication list given to you today.   Labwork: None ordered.  Testing/Procedures: None ordered.  Follow-Up: Your physician recommends that you schedule a follow-up appointment in: One Year with Dr [place]aciela [place].   Any Other Special Instructions Will Be Listed Below (If Applicable).     If you need a refill on your cardiac medications before your next appointment, please call your pharmacy.

## 2018-02-09 NOTE — Telephone Encounter (Signed)
Patient was to call back to schedule. Patient wanted to see how much it would cost her and talk to her insurance company before scheduling. Patient will call back when she is ready to schedule.

## 2018-08-10 ENCOUNTER — Other Ambulatory Visit: Payer: Self-pay | Admitting: Cardiovascular Disease

## 2018-08-10 DIAGNOSIS — R002 Palpitations: Secondary | ICD-10-CM

## 2019-02-04 ENCOUNTER — Other Ambulatory Visit: Payer: Self-pay | Admitting: Cardiovascular Disease

## 2019-02-04 DIAGNOSIS — R002 Palpitations: Secondary | ICD-10-CM

## 2020-03-25 ENCOUNTER — Other Ambulatory Visit: Payer: Self-pay | Admitting: Cardiovascular Disease

## 2020-03-25 DIAGNOSIS — R002 Palpitations: Secondary | ICD-10-CM

## 2020-04-08 ENCOUNTER — Other Ambulatory Visit: Payer: Self-pay | Admitting: Cardiovascular Disease

## 2020-04-08 DIAGNOSIS — R002 Palpitations: Secondary | ICD-10-CM

## 2020-04-08 NOTE — Telephone Encounter (Signed)
Refill Request.  

## 2020-12-24 NOTE — Progress Notes (Signed)
Patient Care Team: Johny Blamer, MD as PCP - General (Family Medicine)   HPI  Kaylee Mayo is a 60 y.o. female Seen in follow-up for symptomatic PACs and PVCs.  Largely triggered by stress.  She is quite fit.  She works for the [employer].  Walks frequently for her job.  Hikes with her husband sometimes 10-20 miles, [facility].  Very steep inclines are problematic.  No chest pain or edema.  Stress is better  Records and Results Reviewed  Past Medical History:  Diagnosis Date  . Anal fissure   . IBS (irritable bowel syndrome)   . PAC (premature atrial contraction)   . Psoriasis   . PVC's (premature ventricular contractions)   . Stress   . Thyroid disease     History reviewed. No pertinent surgical history.  Current Meds  Medication Sig  . Calcium Carbonate-Vitamin D (CALCIUM-D PO) Take 1 tablet by mouth daily.  . cyclobenzaprine (FLEXERIL) 10 MG tablet Take 10 mg by mouth 3 (three) times daily as needed for muscle spasms.  Marland Kitchen estradiol (ESTRACE) 1 MG tablet Take 1 tablet by mouth daily.  Marland Kitchen etodolac (LODINE) 400 MG tablet Take 400 mg by mouth as needed (inflamed saliva glands).  . fluticasone (FLONASE) 50 MCG/ACT nasal spray Place 1 spray into both nostrils as needed.  . Glucosamine-Chondroit-Vit C-Mn (GLUCOSAMINE 1500 COMPLEX PO) Take 1 tablet by mouth.  . Hyoscyamine Sulfate (HYOSCYAMINE PO) Take 0.125 mg by mouth as needed (gas pain).  . IBUPROFEN PO Take 1 tablet by mouth as needed (back pain).  Marland Kitchen levothyroxine (SYNTHROID, LEVOTHROID) 88 MCG tablet Take 88 mcg by mouth daily.   . meloxicam (MOBIC) 15 MG tablet Take 15 mg by mouth as needed.  . Multiple Vitamin (MULTIVITAMIN) tablet Take 1 tablet by mouth daily.  . Omega-3 Fatty Acids (FISH OIL) 1000 MG CAPS Take 1 capsule by mouth daily.  Marland Kitchen OVER THE COUNTER MEDICATION Take 150 mg by mouth daily. Take 1 teaspoons (150mg )  In water by mouth daily  . progesterone (PROMETRIUM) 100 MG capsule Take  100 mg by mouth at bedtime.  . propranolol (INDERAL) 20 MG tablet Take 1 tablet (20 mg total) by mouth as needed (for palpitations). Must schedule overdue follow up appt for further refills, call [phone], 2nd attempt  . ValACYclovir HCl (VALTREX PO) Take 1 tablet by mouth as needed (Cold sore).  . vitamin C (ASCORBIC ACID) 500 MG tablet Take 500 mg by mouth daily.    Allergies  Allergen Reactions  . Sulfa Antibiotics Hives  . Daypro [Oxaprozin]       Review of Systems negative except from HPI and PMH  Physical Exam BP 126/62 (BP Location: Left Arm, Patient Position: Sitting, Cuff Size: Normal)   Pulse 74   Ht 5\' 10"  (1.778 m)   Wt 160 lb (72.6 kg)   SpO2 98%   BMI 22.96 kg/m  Well developed and well nourished in no acute distress HENT normal E scleral and icterus clear Neck Supple JVP flat; carotids brisk and full Clear to ausculation  Regular rate and rhythm, no murmurs gallops or rub Soft with active bowel sounds No clubbing cyanosis  Edema Alert and oriented, grossly normal motor and sensory function Skin Warm and Dry  ECG sinus at 74 Intervals 13/10/30  CrCl cannot be calculated (No successful lab value found.).   Assessment and  Plan PACs  PVCs  Stress  Patient has intermittent palpitations.  Aggravated by stress.  She has been dealing with her stress with moderate success.  Exercise tolerance remains excellent.  We will continue to follow.    Current medicines are reviewed at length with the patient today .    have concerns regarding medicines.

## 2020-12-25 ENCOUNTER — Ambulatory Visit (INDEPENDENT_AMBULATORY_CARE_PROVIDER_SITE_OTHER): Payer: BC Managed Care – PPO | Admitting: Internal Medicine

## 2020-12-25 ENCOUNTER — Other Ambulatory Visit: Payer: Self-pay

## 2020-12-25 ENCOUNTER — Encounter: Payer: Self-pay | Admitting: Internal Medicine

## 2020-12-25 VITALS — BP 126/62 | HR 74 | Ht 70.0 in | Wt 160.0 lb

## 2020-12-25 DIAGNOSIS — I491 Atrial premature depolarization: Secondary | ICD-10-CM | POA: Diagnosis not present

## 2020-12-25 DIAGNOSIS — I441 Atrioventricular block, second degree: Secondary | ICD-10-CM | POA: Diagnosis not present

## 2020-12-25 DIAGNOSIS — I493 Ventricular premature depolarization: Secondary | ICD-10-CM | POA: Diagnosis not present

## 2020-12-25 NOTE — Patient Instructions (Signed)
Medication Instructions:  - Your physician recommends that you continue on your current medications as directed. Please refer to the Current Medication list given to you today.  *If you need a refill on your cardiac medications before your next appointment, please call your pharmacy*   Lab Work: - none ordered  If you have labs (blood work) drawn today and your tests are completely normal, you will receive your results only by: . MyChart Message (if you have MyChart) OR . A paper copy in the mail If you have any lab test that is abnormal or we need to change your treatment, we will call you to review the results.   Testing/Procedures: - none ordered   Follow-Up: At [facility], you and your health needs are our priority.  As part of our continuing mission to provide you with exceptional heart care, we have created designated Provider Care Teams.  These Care Teams include your primary Cardiologist (physician) and Advanced Practice Providers (APPs -  Physician Assistants and Nurse Practitioners) who all work together to provide you with the care you need, when you need it.  We recommend signing up for the patient portal called "MyChart".  Sign up information is provided on this After Visit Summary.  MyChart is used to connect with patients for Virtual Visits (Telemedicine).  Patients are able to view lab/test results, encounter notes, upcoming appointments, etc.  Non-urgent messages can be sent to your provider as well.   To learn more about what you can do with MyChart, go to [email or web address].    Your next appointment:   2 year(s)  The format for your next appointment:   In Person  Provider:   Steven Klein, MD   Other Instructions n/a  

## 2023-08-18 ENCOUNTER — Encounter: Payer: Self-pay | Admitting: Internal Medicine

## 2023-08-18 ENCOUNTER — Ambulatory Visit: Payer: BC Managed Care – PPO | Attending: Internal Medicine | Admitting: Internal Medicine

## 2023-08-18 VITALS — BP 118/76 | HR 63 | Ht 70.0 in | Wt 157.2 lb

## 2023-08-18 DIAGNOSIS — R079 Chest pain, unspecified: Secondary | ICD-10-CM

## 2023-08-18 NOTE — Progress Notes (Signed)
Patient Care Team: Noberto Retort, MD as PCP - General (Family Medicine)   HPI  Kaylee Mayo is a 62 y.o. female Seen in follow-up for symptomatic PACs and PVCs.  Largely triggered by stress.  She remains quite fit  About 3 weeks ago had an episode that was quite different.  Lasted 5-10 minutes.  Fast and irregular.  All of these are somewhat discombobulated.  Stress has been increased as her grandson has come into their custody.  (His mother, her daughter, will be released from prison in the next year.  DATE TEST EF   12/18 Echo   55-60 %   1/19 Echo-stress    % Normal                 Stress is better  Records and Results Reviewed  Past Medical History:  Diagnosis Date   Anal fissure    IBS (irritable bowel syndrome)    PAC (premature atrial contraction)    Psoriasis    PVC's (premature ventricular contractions)    Stress    Thyroid disease     History reviewed. No pertinent surgical history.  Current Meds  Medication Sig   Calcium Carbonate-Vitamin D (CALCIUM-D PO) Take 1 tablet by mouth. Every other day   cyclobenzaprine (FLEXERIL) 10 MG tablet Take 10 mg by mouth 3 (three) times daily as needed for muscle spasms.   estradiol (ESTRACE) 1 MG tablet Take 1 tablet by mouth daily.   etodolac (LODINE) 400 MG tablet Take 400 mg by mouth as needed (inflamed saliva glands).   fluticasone (FLONASE) 50 MCG/ACT nasal spray Place 1 spray into both nostrils as needed.   Glucosamine-Chondroit-Vit C-Mn (GLUCOSAMINE 1500 COMPLEX PO) Take 1 tablet by mouth every other day.   Hyoscyamine Sulfate (HYOSCYAMINE PO) Take 0.125 mg by mouth as needed (gas pain).   IBUPROFEN PO Take 1 tablet by mouth as needed (back pain).   levothyroxine (SYNTHROID, LEVOTHROID) 88 MCG tablet Take 88 mcg by mouth daily.    magnesium oxide (MAG-OX) 400 (240 Mg) MG tablet Take 400 mg by mouth every other day.   meloxicam (MOBIC) 15 MG tablet Take 15 mg by mouth as needed.   Multiple  Vitamin (MULTIVITAMIN) tablet Take 1 tablet by mouth every other day.   Omega-3 Fatty Acids (FISH OIL) 1000 MG CAPS Take 1 capsule by mouth every other day.   progesterone (PROMETRIUM) 100 MG capsule Take 100 mg by mouth at bedtime.   propranolol (INDERAL) 20 MG tablet Take 1 tablet (20 mg total) by mouth as needed (for palpitations). Must schedule overdue follow up appt for further refills, call [phone], 2nd attempt   Turmeric 500 MG CAPS Take by mouth every other day.   ValACYclovir HCl (VALTREX PO) Take 1 tablet by mouth as needed (Cold sore).   vitamin C (ASCORBIC ACID) 500 MG tablet Take 500 mg by mouth every other day.   [DISCONTINUED] OVER THE COUNTER MEDICATION Take 150 mg by mouth daily. Take 1 teaspoons (150mg )  In water by mouth daily    Allergies  Allergen Reactions   Sulfa Antibiotics Hives   Daypro [Oxaprozin]       Review of Systems negative except from HPI and PMH  Physical Exam Pulse 63   Ht 5\' 10"  (1.778 m)   Wt 157 lb 3.2 oz (71.3 kg)   SpO2 99%   BMI 22.56 kg/m  Well developed and nourished in no acute distress HENT normal Neck supple  with JVP-  flat   Clear Regular rate and rhythm, no murmurs or gallops Abd-soft with active BS No Clubbing cyanosis edema Skin-warm and dry A & Oriented  Grossly normal sensory and motor function  ECG sinus @ 63 16/09/40   CrCl cannot be calculated (No successful lab value found.).   Assessment and  Plan PACs  PVCs  Stress  Continues with intermittent palpitations.  She thinks related to stress.  More of that with her grandson in her custody. The new palpitation syndrome, fast and irregular, we will use a [employer] monitor to try to elucidate.

## 2023-08-18 NOTE — Patient Instructions (Signed)
Medication Instructions:  The current medical regimen is effective;  continue present plan and medications.  *If you need a refill on your cardiac medications before your next appointment, please call your pharmacy*   Follow-Up: At [facility], you and your health needs are our priority.  As part of our continuing mission to provide you with exceptional heart care, we have created designated Provider Care Teams.  These Care Teams include your primary Cardiologist (physician) and Advanced Practice Providers (APPs -  Physician Assistants and Nurse Practitioners) who all work together to provide you with the care you need, when you need it.  We recommend signing up for the patient portal called "MyChart".  Sign up information is provided on this After Visit Summary.  MyChart is used to connect with patients for Virtual Visits (Telemedicine).  Patients are able to view lab/test results, encounter notes, upcoming appointments, etc.  Non-urgent messages can be sent to your provider as well.   To learn more about what you can do with MyChart, go to [email or web address].    Your next appointment:   2 year(s)  Provider:   Sherryl Manges, MD  or Sherie Don, NP

## 2024-12-10 ENCOUNTER — Telehealth: Payer: Self-pay | Admitting: Cardiology

## 2024-12-10 NOTE — Telephone Encounter (Signed)
 Pt returning call. Please advise.

## 2024-12-10 NOTE — Telephone Encounter (Signed)
 Patient c/o Palpitations: STAT if patient c/o lightheadedness, shortness of breath, or chest pain  How long have you had palpitations/irregular HR/ Afib? Are you having the symptoms now? Last week  Are you currently experiencing lightheadedness, SOB or CP? no  Do you have a history of afib (atrial fibrillation) or irregular heart rhythm? no  Have you checked your BP or HR? (document readings if available):  no  Are you experiencing any other symptoms? no   Please advise. PT requesting soon appt.

## 2024-12-11 NOTE — Telephone Encounter (Signed)
 Spoke with the patient who states that she has had an increase in palpitations over the past couple of weeks. She states that it has been about two years since she has had any. She states that she was a previous patient of Dr. Retia and he told her she had PACs/PVCs. She denies any symptoms associated with the palpitations. She does have propanolol to take as needed which does help. She has not had to use it very frequently. She would like to be seen to make sure that she is not having any new arrhythmias.I have moved her appointment to see an APP.

## 2024-12-25 NOTE — Progress Notes (Unsigned)
 "     Electrophysiology Clinic Note    Date:  12/26/2024  Patient ID:  Kaylee Mayo, Kaylee Mayo 31-Aug-1961, MRN [id number] PCP:  Arloa Elsie SAUNDERS, MD  Cardiologist:  None  Electrophysiologist:  Fonda Kitty, MD  Electrophysiology APP:  Dameian Crisman, NP    Discussed the use of AI scribe software for clinical note transcription with the patient, who gave verbal consent to proceed.   Patient Profile    Chief Complaint: palpitations  History of Present Illness: Kaylee Mayo is a 64 y.o. female with PMH notable for PAC, PVC, hypothyroid; seen today for Fonda Kitty, MD (Previously Dr. Fernande) for acute visit due to increased palpitations.    She last saw Dr. Fernande 08/2023 at which time she had a slight increase in palpitations.  Historically palpitations were d/t stress.    She called clinic 11/2024 that she was having increased palpitations.  She was taking as needed propranolol  that was improving but requested appointment to reevaluate.  On follow-up today, she has not had any further significant palpitation episodes as the 2 she had at the end of December.  She confirms that her as needed metoprolol was effective at improving her palpitations.  She cannot remember the last time she took her as needed propranolol  prior to these episodes. She denies chest pain, chest pressure, shortness of breath.  She does admit to reduced exercise, her job has transitioned more to a managerial position.        Arrhythmia/Device History No specialty comments available.    ROS:  Please see the history of present illness. All other systems are reviewed and otherwise negative.    Physical Exam    VS:  BP 120/72 (BP Location: Left Arm, Patient Position: Sitting, Cuff Size: Normal)   Pulse 67   Ht 5' 10 (1.778 m)   Wt 159 lb 9.6 oz (72.4 kg)   SpO2 98%   BMI 22.90 kg/m  BMI: Body mass index is 22.9 kg/m.           Wt Readings from Last 3 Encounters:  12/26/24 159 lb 9.6 oz (72.4  kg)  08/18/23 157 lb 3.2 oz (71.3 kg)  12/25/20 160 lb (72.6 kg)      GEN- The patient is well appearing, alert and oriented x 3 today.   Lungs- Clear to ausculation bilaterally, normal work of breathing.  Heart- Regular rate and rhythm with rare ectopy, no murmurs, rubs or gallops Extremities- No peripheral edema, warm, dry   Studies Reviewed   Previous EP, cardiology notes.    EKG is ordered. Personal review of EKG from today shows:    EKG Interpretation Date/Time:  Wednesday December 26 2024 09:48:45 EST Ventricular Rate:  67 PR Interval:  136 QRS Duration:  84 QT Interval:  400 QTC Calculation: 422 R Axis:   52  Text Interpretation: Normal sinus rhythm Possible Left atrial enlargement Confirmed by Maziah Keeling [phone]) on 12/26/2024 9:55:20 AM    Rhythm strip - SR with occasional PVC  Stress echo, 01/09/2018 - Stress ECG conclusions: There were no stress arrhythmias or    conduction abnormalities. The stress ECG was negative for    ischemia.  - Staged echo: There was no echocardiographic evidence for    stress-induced ischemia.   Impressions:   - No ischemia. Normal hyperdynamic response to exertion.    Excellent exercise capacity.    Normal blood pressure response to exertion.    Frequent PACs.   Holter monitor, 11/15/2017 NSR  Average HR 70  PAC;s / PVC;s Short bursts atrial tachycardia less than 5 beats Occasional couplets and NSVT 3 beats Rare episodes of Wenkebach type 2 AV block   TTE, 11/15/2017 - Left ventricle: The cavity size was normal. Wall thickness was    normal. Systolic function was normal. The estimated ejection    fraction was in the range of 55% to 60%. Wall motion was normal;    there were no regional wall motion abnormalities. Doppler    parameters are consistent with abnormal left ventricular    relaxation (grade 1 diastolic dysfunction).  - Aortic valve: There was no stenosis.  - Mitral valve: There was trivial regurgitation.  - Right  ventricle: The cavity size was normal. Systolic function    was normal.  - Tricuspid valve: Peak RV-RA gradient (S): 17 mm Hg.  - Pulmonary arteries: PA peak pressure: 32 mm Hg (S).  - Systemic veins: IVC measured 2.4 cm with < 50% respirophasic    variation, suggesting RA pressure 15 mmHg.    Assessment and Plan     #) palpitations #) PAC #) PVCs Overall very low palpitation burden though did have recent increase in episodes towards the end of December after not having episodes for at least a year.  She questions whether this was due to increased stress around the holidays. As needed propranolol  was effective during these episodes We will plan to update TTE to confirm normal LVEF We discussed that as long as EF is normal, the goal of treatment is to improve symptoms. Will continue as needed propranolol  at this time      Current medicines are reviewed at length with the patient today.   The patient has concerns regarding her medicines.  The following changes were made today:  none  Labs/ tests ordered today include:  Orders Placed This Encounter  Procedures   EKG 12-Lead   ECHOCARDIOGRAM COMPLETE     Disposition: Follow up with Dr. Kennyth or EP APP in 12 months   Signed, Taaj Hurlbut, NP  12/26/2024  1:36 PM  Electrophysiology [facility] "

## 2024-12-26 ENCOUNTER — Ambulatory Visit: Payer: Self-pay | Attending: Cardiology | Admitting: Cardiology

## 2024-12-26 ENCOUNTER — Encounter: Payer: Self-pay | Admitting: Cardiology

## 2024-12-26 VITALS — BP 120/72 | HR 67 | Ht 70.0 in | Wt 159.6 lb

## 2024-12-26 DIAGNOSIS — I493 Ventricular premature depolarization: Secondary | ICD-10-CM

## 2024-12-26 DIAGNOSIS — R002 Palpitations: Secondary | ICD-10-CM | POA: Insufficient documentation

## 2024-12-26 NOTE — Patient Instructions (Signed)
 Medication Instructions:  Your physician recommends that you continue on your current medications as directed. Please refer to the Current Medication list given to you today.  *If you need a refill on your cardiac medications before your next appointment, please call your pharmacy*  Lab Work: No labs ordered today    Testing/Procedures: Your physician has requested that you have an echocardiogram. Echocardiography is a painless test that uses sound waves to create images of your heart. It provides your doctor with information about the size and shape of your heart and how well your hearts chambers and valves are working.   You may receive an ultrasound enhancing agent through an IV if needed to better visualize your heart during the echo. This procedure takes approximately one hour.  There are no restrictions for this procedure.  This will take place at [address] [facility], [place] [address]  Please note: We ask at that you not bring children with you during ultrasound (echo/ vascular) testing. Due to room size and safety concerns, children are not allowed in the ultrasound rooms during exams. Our front office staff cannot provide observation of children in our lobby area while testing is being conducted. An adult accompanying a patient to their appointment will only be allowed in the ultrasound room at the discretion of the ultrasound technician under special circumstances. We apologize for any inconvenience.   Follow-Up: At [facility], you and your health needs are our priority.  As part of our continuing mission to provide you with exceptional heart care, our providers are all part of one team.  This team includes your primary Cardiologist (physician) and Advanced Practice Providers or APPs (Physician Assistants and Nurse Practitioners) who all work together to provide you with the care you need, when you need it.  Your next appointment:   1  year(s)  Provider:   Suzann Riddle, NP

## 2025-01-01 ENCOUNTER — Ambulatory Visit: Payer: Self-pay | Admitting: Cardiology

## 2025-01-11 ENCOUNTER — Ambulatory Visit: Attending: Cardiology

## 2025-01-11 ENCOUNTER — Ambulatory Visit: Payer: Self-pay | Admitting: Cardiology

## 2025-01-11 DIAGNOSIS — R002 Palpitations: Secondary | ICD-10-CM | POA: Diagnosis not present

## 2025-01-11 LAB — ECHOCARDIOGRAM COMPLETE
AR max vel: 2.3 cm2
AV Area VTI: 2.44 cm2
AV Area mean vel: 2.37 cm2
AV Mean grad: 4 mmHg
AV Peak grad: 7.4 mmHg
Ao pk vel: 1.36 m/s
Area-P 1/2: 3.16 cm2
S' Lateral: 3.06 cm

## 2025-01-16 ENCOUNTER — Ambulatory Visit: Payer: Self-pay | Admitting: Cardiology
